# Patient Record
Sex: Female | Born: 1975 | Race: White | Hispanic: No | Marital: Single | Smoking: Current some day smoker
Health system: Southern US, Community
[De-identification: ages and names within clinical notes are randomized; demographics above are authoritative.]

## PROBLEM LIST (undated history)

## (undated) DIAGNOSIS — K219 Gastro-esophageal reflux disease without esophagitis: Secondary | ICD-10-CM

## (undated) HISTORY — PX: KNEE SURGERY: SHX244

## (undated) HISTORY — PX: TUBAL LIGATION: SHX77

## (undated) HISTORY — PX: EYE SURGERY: SHX253

---

## 2005-09-07 ENCOUNTER — Emergency Department (HOSPITAL_COMMUNITY): Admission: EM | Admit: 2005-09-07 | Discharge: 2005-09-07 | Payer: Self-pay | Admitting: Emergency Medicine

## 2009-01-02 ENCOUNTER — Emergency Department (HOSPITAL_COMMUNITY): Admission: EM | Admit: 2009-01-02 | Discharge: 2009-01-02 | Payer: Self-pay | Admitting: *Deleted

## 2009-10-06 ENCOUNTER — Emergency Department (HOSPITAL_COMMUNITY): Admission: EM | Admit: 2009-10-06 | Discharge: 2009-10-06 | Payer: Self-pay | Admitting: Emergency Medicine

## 2009-12-03 ENCOUNTER — Emergency Department (HOSPITAL_BASED_OUTPATIENT_CLINIC_OR_DEPARTMENT_OTHER): Admission: EM | Admit: 2009-12-03 | Discharge: 2009-12-04 | Payer: Self-pay | Admitting: Emergency Medicine

## 2009-12-04 ENCOUNTER — Ambulatory Visit: Payer: Self-pay | Admitting: Radiology

## 2009-12-06 ENCOUNTER — Emergency Department (HOSPITAL_BASED_OUTPATIENT_CLINIC_OR_DEPARTMENT_OTHER): Admission: EM | Admit: 2009-12-06 | Discharge: 2009-12-06 | Payer: Self-pay | Admitting: Emergency Medicine

## 2009-12-06 ENCOUNTER — Ambulatory Visit: Payer: Self-pay | Admitting: Radiology

## 2010-01-14 ENCOUNTER — Emergency Department (HOSPITAL_BASED_OUTPATIENT_CLINIC_OR_DEPARTMENT_OTHER): Admission: EM | Admit: 2010-01-14 | Discharge: 2010-01-14 | Payer: Self-pay | Admitting: Emergency Medicine

## 2010-01-14 ENCOUNTER — Ambulatory Visit: Payer: Self-pay | Admitting: Radiology

## 2010-02-12 ENCOUNTER — Ambulatory Visit: Payer: Self-pay | Admitting: Diagnostic Radiology

## 2010-02-12 ENCOUNTER — Emergency Department (HOSPITAL_BASED_OUTPATIENT_CLINIC_OR_DEPARTMENT_OTHER): Admission: EM | Admit: 2010-02-12 | Discharge: 2010-02-12 | Payer: Self-pay | Admitting: Emergency Medicine

## 2010-03-27 ENCOUNTER — Emergency Department (HOSPITAL_BASED_OUTPATIENT_CLINIC_OR_DEPARTMENT_OTHER): Admission: EM | Admit: 2010-03-27 | Discharge: 2010-03-27 | Payer: Self-pay | Admitting: Emergency Medicine

## 2010-03-27 ENCOUNTER — Ambulatory Visit: Payer: Self-pay | Admitting: Diagnostic Radiology

## 2010-10-09 LAB — COMPREHENSIVE METABOLIC PANEL
Albumin: 3.9 g/dL (ref 3.5–5.2)
BUN: 8 mg/dL (ref 6–23)
GFR calc Af Amer: >60 mL/min (ref >60)
GFR calc non Af Amer: >60 mL/min (ref >60)
Glucose, Bld: 80 mg/dL (ref 70–99)
Potassium: 4 mEq/L (ref 3.5–5.1)
Sodium: 141 mEq/L (ref 135–145)
Total Bilirubin: 0.4 mg/dL (ref 0.3–1.2)
Total Protein: 7 g/dL (ref 6.0–8.3)

## 2010-10-09 LAB — DIFFERENTIAL
Basophils Absolute: 0.1 10*3/uL (ref 0.0–0.1)
Basophils Relative: 1 % (ref 0–1)
Eosinophils Absolute: 0.2 10*3/uL (ref 0.0–0.7)
Monocytes Absolute: 0.5 10*3/uL (ref 0.1–1.0)
Neutro Abs: 9.5 10*3/uL — ABNORMAL HIGH (ref 1.7–7.7)
Neutrophils Relative %: 69 % (ref 43–77)

## 2010-10-09 LAB — URINALYSIS, ROUTINE W REFLEX MICROSCOPIC
Glucose, UA: NEGATIVE mg/dL
Hgb urine dipstick: NEGATIVE
Ketones, ur: NEGATIVE mg/dL
Protein, ur: NEGATIVE mg/dL
Specific Gravity, Urine: 1.011 (ref 1.005–1.030)
Urobilinogen, UA: 0.2 mg/dL (ref 0.0–1.0)

## 2010-10-09 LAB — CBC
MCH: 33.4 pg (ref 26.0–34.0)
MCHC: 34.7 g/dL (ref 30.0–36.0)
MCV: 96.3 fL (ref 78.0–100.0)
WBC: 13.5 10*3/uL — ABNORMAL HIGH (ref 4.0–10.5)

## 2010-10-17 LAB — URINALYSIS, ROUTINE W REFLEX MICROSCOPIC
Bilirubin Urine: NEGATIVE
Glucose, UA: NEGATIVE mg/dL
Hgb urine dipstick: NEGATIVE
Ketones, ur: NEGATIVE mg/dL
Nitrite: NEGATIVE
Protein, ur: NEGATIVE mg/dL
Specific Gravity, Urine: 1.021 (ref 1.005–1.030)
Urobilinogen, UA: 1 mg/dL (ref 0.0–1.0)
pH: 8 (ref 5.0–8.0)

## 2010-10-17 LAB — URINE MICROSCOPIC-ADD ON

## 2010-10-17 LAB — POCT PREGNANCY, URINE: Preg Test, Ur: NEGATIVE

## 2010-10-31 LAB — URINALYSIS, ROUTINE W REFLEX MICROSCOPIC
Ketones, ur: NEGATIVE mg/dL
Nitrite: NEGATIVE
Specific Gravity, Urine: 1.024 (ref 1.005–1.030)

## 2010-10-31 LAB — URINE MICROSCOPIC-ADD ON

## 2010-10-31 LAB — POCT PREGNANCY, URINE: Preg Test, Ur: NEGATIVE

## 2013-05-02 ENCOUNTER — Encounter (HOSPITAL_COMMUNITY): Payer: Self-pay | Admitting: Emergency Medicine

## 2013-05-02 ENCOUNTER — Emergency Department (HOSPITAL_COMMUNITY)
Admission: EM | Admit: 2013-05-02 | Discharge: 2013-05-02 | Disposition: A | Discharge status: Home / Self Care | Payer: Medicaid Other | Attending: Emergency Medicine | Admitting: Emergency Medicine

## 2013-05-02 ENCOUNTER — Emergency Department (HOSPITAL_COMMUNITY): Payer: Medicaid Other

## 2013-05-02 DIAGNOSIS — Z880 Allergy status to penicillin: Secondary | ICD-10-CM | POA: Insufficient documentation

## 2013-05-02 DIAGNOSIS — S6990XA Unspecified injury of unspecified wrist, hand and finger(s), initial encounter: Secondary | ICD-10-CM | POA: Insufficient documentation

## 2013-05-02 DIAGNOSIS — Y939 Activity, unspecified: Secondary | ICD-10-CM | POA: Insufficient documentation

## 2013-05-02 DIAGNOSIS — M79641 Pain in right hand: Secondary | ICD-10-CM

## 2013-05-02 DIAGNOSIS — S59909A Unspecified injury of unspecified elbow, initial encounter: Secondary | ICD-10-CM | POA: Insufficient documentation

## 2013-05-02 DIAGNOSIS — W230XXA Caught, crushed, jammed, or pinched between moving objects, initial encounter: Secondary | ICD-10-CM | POA: Insufficient documentation

## 2013-05-02 DIAGNOSIS — Y929 Unspecified place or not applicable: Secondary | ICD-10-CM | POA: Insufficient documentation

## 2013-05-02 DIAGNOSIS — F17200 Nicotine dependence, unspecified, uncomplicated: Secondary | ICD-10-CM | POA: Insufficient documentation

## 2013-05-02 NOTE — ED Notes (Signed)
Presents with right hand injury, hand was crushed between 2 logs, accident occurred one hour ago. Hand swollen, able to wiggle digits. CMS intact.

## 2013-05-02 NOTE — ED Provider Notes (Signed)
CSN: [id number]     Arrival date & time 05/02/13  2102 History   This chart was scribed for non-physician practitioner Roxy Horseman, PA-C, working with Gilda Crease, MD by Dorothey Baseman, ED Scribe. This patient was seen in room TR09C/TR09C and the patient's care was started at 9:25 PM.    Chief Complaint  Patient presents with  . Hand Injury   The history is provided by the patient. No language interpreter was used.   HPI Comments: Diane Norris is a 37 y.o. female who presents to the Emergency Department with a chief complaint of an injury to the right hand that occurred approximately 1 hour ago when the patient reports that her hand was crushed between 2 large logs. She reports an associated constant pain to the right hand and wrist with swelling to the area.  History reviewed. No pertinent past medical history. History reviewed. No pertinent past surgical history. History reviewed. No pertinent family history. History  Substance Use Topics  . Smoking status: Current Every Day Smoker    Types: Cigarettes  . Smokeless tobacco: Not on file  . Alcohol Use: No   OB History   Grav Para Term Preterm Abortions TAB SAB Ect Mult Living                 Review of Systems  A complete 10 system review of systems was obtained and all systems are negative except as noted in the HPI and PMH.   Allergies  Nsaids and Penicillins  Home Medications  No current outpatient prescriptions on file.  BP 108/62  Pulse 100  Temp(Src) 98.1 F (36.7 C) (Oral)  Resp 18  Wt 140 lb (63.504 kg)  SpO2 98%  Physical Exam  Nursing note and vitals reviewed. Constitutional: She is oriented to person, place, and time. She appears well-developed and well-nourished. No distress.  HENT:  Head: Normocephalic and atraumatic.  Eyes: Conjunctivae are normal.  Neck: Normal range of motion. Neck supple.  Cardiovascular: Intact distal pulses.   Brisk capillary refill.   Pulmonary/Chest: Effort  normal. No respiratory distress.  Abdominal: She exhibits no distension.  Musculoskeletal: Normal range of motion.  Right hand is tender to palpation diffusely with moderate right wrist tenderness, moderate swelling to the right wrist over the 4th and 5th metacarpal. Range of motion and strength is reduced secondary to pain.   Neurological: She is alert and oriented to person, place, and time.  Sensation and strength is intact.   Skin: Skin is warm and dry.  Psychiatric: She has a normal mood and affect. Her behavior is normal.    ED Course  Procedures (including critical care time)  DIAGNOSTIC STUDIES: Oxygen Saturation is 98% on room air, normal by my interpretation.    COORDINATION OF CARE: 9:28PM- Will order x-rays of the right hand and wrist. Discussed treatment plan with patient at bedside and patient verbalized agreement.     Labs Review Labs Reviewed - No data to display  Imaging Review Dg Wrist Complete Right  05/02/2013   *RADIOLOGY REPORT*  Clinical Data: Smashed right hand and wrist between two logs; right wrist pain.  RIGHT WRIST - COMPLETE 3+ VIEW  Comparison: Right hand radiographs performed 12/20/2012  Findings: There is no evidence of fracture or dislocation.  The carpal rows are intact, and demonstrate normal alignment.  The joint spaces are preserved.  Negative ulnar variance is noted.  No significant soft tissue abnormalities are seen.  IMPRESSION: No evidence of fracture or dislocation.  Original Report Authenticated By: Tonia Ghent, M.D.   Dg Hand Complete Right  05/02/2013   *RADIOLOGY REPORT*  Clinical Data: Smashed right hand and wrist between two logs; pain at the third metacarpal and metacarpophalangeal joint.  RIGHT HAND - COMPLETE 3+ VIEW  Comparison: Right hand radiographs performed 12/20/2012  Findings: There is no evidence of fracture or dislocation.  The joint spaces are preserved; the soft tissues are unremarkable in appearance.  The carpal rows are  intact, and demonstrate normal alignment.  Negative ulnar variance is noted.  IMPRESSION: No evidence of fracture or dislocation.   Original Report Authenticated By: Tonia Ghent, M.D.    EKG Interpretation   None       MDM   1. Right hand pain    Patient with right hand pain/sprain following having a piece of wood hit her hand. No fractures. Patient placed in wrist splint. Rice therapy and NSAIDs. Patient stable and ready for discharge. Hand followup. I personally performed the services described in this documentation, which was scribed in my presence. The recorded information has been reviewed and is accurate.     Roxy Horseman, PA-C 05/03/13 0000

## 2013-05-04 NOTE — ED Provider Notes (Signed)
Medical screening examination/treatment/procedure(s) were performed by non-physician practitioner and as supervising physician I was immediately available for consultation/collaboration.    Christopher J. Pollina, MD 05/04/13 1555 

## 2013-07-25 ENCOUNTER — Ambulatory Visit
Admission: RE | Admit: 2013-07-25 | Discharge: 2013-07-25 | Disposition: A | Discharge status: Home / Self Care | Payer: Medicaid Other | Source: Ambulatory Visit | Attending: Physician Assistant | Admitting: Physician Assistant

## 2013-07-25 ENCOUNTER — Other Ambulatory Visit: Payer: Self-pay | Admitting: Physician Assistant

## 2013-07-25 DIAGNOSIS — R519 Headache, unspecified: Secondary | ICD-10-CM

## 2013-07-25 DIAGNOSIS — G971 Other reaction to spinal and lumbar puncture: Secondary | ICD-10-CM

## 2013-07-25 DIAGNOSIS — R51 Headache: Principal | ICD-10-CM

## 2013-07-25 MED ORDER — IOHEXOL 180 MG/ML  SOLN
1.0000 mL | Freq: Once | INTRAMUSCULAR | Status: AC | PRN
  Administered 2013-07-25: 1 mL via EPIDURAL

## 2013-07-25 NOTE — Progress Notes (Signed)
Blood drawn for blood patch from left AC, site is unremarkable and pt tolerated blood draw well.

## 2013-07-25 NOTE — Discharge Instructions (Signed)
Blood Patch Discharge Instructions ° °1. Go home and rest quietly for the next 24 hours.  It is important to lie flat for the next 24 hours.  Get up only to go to the restroom.  You may lie in the bed or on a couch on your back, your stomach, your left side or your right side.  You may have one pillow under your head.  You may have pillows between your knees while you are on your side or under your knees while you are on your back. ° °2. DO NOT drive today.  Recline the seat as far back as it will go, while still wearing your seat belt, on the way home. ° °3. You may get up to go to the bathroom as needed.  You may sit up for 10 minutes to eat.  You may resume your normal diet and medications unless otherwise indicated.  Drink lots of extra fluids today and tomorrow ° °4. You may resume normal activities after your 24 hours of bed rest is over; however, do not exert yourself strongly or do any heavy lifting tomorrow. ° °5. Call your physician for a follow-up appointment.  ° °6. If you have any questions or if complications develop after you arrive home, please call [phone]. ° °Discharge instructions have been explained to the patient.  The patient, or the person responsible for the patient, fully understands these instructions. °

## 2014-08-04 ENCOUNTER — Encounter (HOSPITAL_BASED_OUTPATIENT_CLINIC_OR_DEPARTMENT_OTHER): Payer: Self-pay | Admitting: Emergency Medicine

## 2014-08-04 ENCOUNTER — Emergency Department (HOSPITAL_BASED_OUTPATIENT_CLINIC_OR_DEPARTMENT_OTHER): Payer: Medicaid Other

## 2014-08-04 ENCOUNTER — Emergency Department (HOSPITAL_BASED_OUTPATIENT_CLINIC_OR_DEPARTMENT_OTHER)
Admission: EM | Admit: 2014-08-04 | Discharge: 2014-08-04 | Disposition: A | Discharge status: Home / Self Care | Payer: Medicaid Other | Attending: Emergency Medicine | Admitting: Emergency Medicine

## 2014-08-04 DIAGNOSIS — Z720 Tobacco use: Secondary | ICD-10-CM | POA: Diagnosis not present

## 2014-08-04 DIAGNOSIS — Z3202 Encounter for pregnancy test, result negative: Secondary | ICD-10-CM | POA: Diagnosis not present

## 2014-08-04 DIAGNOSIS — Z79899 Other long term (current) drug therapy: Secondary | ICD-10-CM | POA: Diagnosis not present

## 2014-08-04 DIAGNOSIS — Y9289 Other specified places as the place of occurrence of the external cause: Secondary | ICD-10-CM | POA: Diagnosis not present

## 2014-08-04 DIAGNOSIS — R1110 Vomiting, unspecified: Secondary | ICD-10-CM | POA: Diagnosis not present

## 2014-08-04 DIAGNOSIS — Z880 Allergy status to penicillin: Secondary | ICD-10-CM | POA: Diagnosis not present

## 2014-08-04 DIAGNOSIS — Y9301 Activity, walking, marching and hiking: Secondary | ICD-10-CM | POA: Insufficient documentation

## 2014-08-04 DIAGNOSIS — S79911A Unspecified injury of right hip, initial encounter: Secondary | ICD-10-CM | POA: Insufficient documentation

## 2014-08-04 DIAGNOSIS — W19XXXA Unspecified fall, initial encounter: Secondary | ICD-10-CM

## 2014-08-04 DIAGNOSIS — Y998 Other external cause status: Secondary | ICD-10-CM | POA: Diagnosis not present

## 2014-08-04 DIAGNOSIS — W108XXA Fall (on) (from) other stairs and steps, initial encounter: Secondary | ICD-10-CM | POA: Insufficient documentation

## 2014-08-04 DIAGNOSIS — M25551 Pain in right hip: Secondary | ICD-10-CM

## 2014-08-04 LAB — PREGNANCY, URINE: Preg Test, Ur: NEGATIVE

## 2014-08-04 MED ORDER — ONDANSETRON 4 MG PO TBDP
4.0000 mg | ORAL_TABLET | Freq: Once | ORAL | Status: AC
  Administered 2014-08-04: 4 mg via ORAL
  Filled 2014-08-04: qty 1

## 2014-08-04 MED ORDER — HYDROCODONE-ACETAMINOPHEN 5-325 MG PO TABS: 1.0000 | ORAL_TABLET | ORAL | Status: DC | PRN

## 2014-08-04 MED ORDER — HYDROCODONE-ACETAMINOPHEN 5-325 MG PO TABS
2.0000 | ORAL_TABLET | Freq: Once | ORAL | Status: AC
  Administered 2014-08-04: 2 via ORAL
  Filled 2014-08-04: qty 2

## 2014-08-04 NOTE — ED Notes (Signed)
Patient transported to X-ray 

## 2014-08-04 NOTE — Discharge Instructions (Signed)
Return to the emergency room with worsening of symptoms, new symptoms or with symptoms that are concerning, especially fevers, redness, swelling, red streaks, severe pain with movement of hip, numbness, tingling, weakness. RICE: Rest, Ice (three cycles of 20 mins on, 20mins off at least twice a day), compression/brace, elevation. Heating pad works well for back pain. Ibuprofen 400mg  (2 tablets 200mg ) every 5-6 hours for 3-5 days Norco for severe pain Do not operate machinery, drive or drink alcohol while taking narcotics or muscle relaxers. Follow up with PCP/orthopedist if symptoms worsen or are persistent.  Hip Pain Your hip is the joint between your upper legs and your lower pelvis. The bones, cartilage, tendons, and muscles of your hip joint perform a lot of work each day supporting your body weight and allowing you to move around. Hip pain can range from a minor ache to severe pain in one or both of your hips. Pain may be felt on the inside of the hip joint near the groin, or the outside near the buttocks and upper thigh. You may have swelling or stiffness as well.  HOME CARE INSTRUCTIONS   Take medicines only as directed by your health care provider.  Apply ice to the injured area:  Put ice in a plastic bag.  Place a towel between your skin and the bag.  Leave the ice on for 15-20 minutes at a time, 3-4 times a day.  Keep your leg raised (elevated) when possible to lessen swelling.  Avoid activities that cause pain.  Follow specific exercises as directed by your health care provider.  Sleep with a pillow between your legs on your most comfortable side.  Record how often you have hip pain, the location of the pain, and what it feels like. SEEK MEDICAL CARE IF:   You are unable to put weight on your leg.  Your hip is red or swollen or very tender to touch.  Your pain or swelling continues or worsens after 1 week.  You have increasing difficulty walking.  You have a  fever. SEEK IMMEDIATE MEDICAL CARE IF:   You have fallen.  You have a sudden increase in pain and swelling in your hip. MAKE SURE YOU:   Understand these instructions.  Will watch your condition.  Will get help right away if you are not doing well or get worse. Document Released: 12/28/2009 Document Revised: 11/24/2013 Document Reviewed: 03/06/2013 [facility] Patient Information 2015 [employer], [place]. This information is not intended to replace advice given to you by your health care provider. Make sure you discuss any questions you have with your health care provider.

## 2014-08-04 NOTE — ED Provider Notes (Signed)
CSN: [id number]     Arrival date & time 08/04/14  1347 History   First MD Initiated Contact with Patient 08/04/14 1512     Chief Complaint  Patient presents with  . Fall     (Consider location/radiation/quality/duration/timing/severity/associated sxs/prior Treatment) HPI  Diane Norris is a 39 y.o. female without significant past medical history presenting after mechanical fall today where she landed on her right hip. Patient states she was walking down stairs and slipped while walking her dog. She noted immediate pain to her right hip as well as her buttock. She denies head injury or loss of consciousness. She denies numbness, tingling, weakness. Patient was ambulatory with significant pain. She has not taken anything for this. Movement and ambulation makes pain worse. Patient has no history of taking steroids long-term or other medical conditions. No history of diabetes.  No past medical history on file. Past Surgical History  Procedure Laterality Date  . Eye surgery    . Knee surgery     No family history on file. History  Substance Use Topics  . Smoking status: Current Every Day Smoker    Types: Cigarettes  . Smokeless tobacco: Not on file  . Alcohol Use: No   OB History    No data available     Review of Systems  Constitutional: Negative for fever and chills.  Gastrointestinal: Positive for vomiting. Negative for nausea and diarrhea.  Musculoskeletal: Positive for myalgias. Negative for gait problem.  Skin: Negative for wound.      Allergies  Nsaids and Penicillins  Home Medications   Prior to Admission medications   Medication Sig Start Date End Date Taking? Authorizing Provider  albuterol (PROVENTIL HFA;VENTOLIN HFA) 108 (90 BASE) MCG/ACT inhaler Inhale 2 puffs into the lungs every 6 (six) hours as needed for wheezing.    Historical Provider, MD  HYDROcodone-acetaminophen (NORCO/VICODIN) 5-325 MG per tablet Take 1-2 tablets by mouth every 4 (four) hours as  needed for moderate pain or severe pain. 08/04/14   Louann Sjogren, PA-C   BP 110/67 mmHg  Pulse 85  Temp(Src) 98.5 F (36.9 C) (Oral)  Resp 14  Ht  (1.651 m)  Wt 138 lb (62.596 kg)  BMI 22.96 kg/m2  SpO2 100%  LMP 08/02/2014 Physical Exam  Constitutional: She appears well-developed and well-nourished. No distress.  HENT:  Head: Normocephalic and atraumatic.  Eyes: Conjunctivae are normal. Right eye exhibits no discharge. Left eye exhibits no discharge.  Cardiovascular:  2+ pedal pulses equal bilaterally  Pulmonary/Chest: Effort normal. No respiratory distress.  Musculoskeletal:  Patient with tenderness to lateral and posterior hip worse with range of motion especially internal and external rotation. No overt deformity noted. No redness or swelling no red streaks.  Neurological: She is alert. Coordination normal.  5/5 strength in bilateral lower extremities. Sensation intact. Antalgic gait  Skin: Skin is warm and dry. She is not diaphoretic.  Psychiatric: She has a normal mood and affect. Her behavior is normal.  Nursing note and vitals reviewed.   ED Course  Procedures (including critical care time) Labs Review Labs Reviewed  PREGNANCY, URINE    Imaging Review Ct Hip Right Wo Contrast  08/04/2014   CLINICAL DATA:  Right hip pain after recent fall. Initial encounter.  EXAM: CT OF THE RIGHT HIP WITHOUT CONTRAST  TECHNIQUE: Multidetector CT imaging of the right hip was performed according to the standard protocol. Multiplanar CT image reconstructions were also generated.  COMPARISON:  Pelvic and right hip radiographs of 08/04/2014.  Pelvic CT 07/20/2014 and 01/23/2013.  FINDINGS: Examination is limited to the right hip and lower right hemipelvis. The right femoral head is located and appears normal. There is no evidence of proximal femur fracture or dislocation. Within the posterior acetabulum, there is serpiginous lucency corresponding with the questioned radiographic  finding. This has somewhat sclerotic margins and appears similar to the prior CTs, most likely due to a penetrating vessel. No definite acetabular fracture identified.  The gluteus and visualized proximal thigh musculature appears unremarkable.  IMPRESSION: 1. No definite acute osseous findings at the right hip. 2. The questioned finding on the prior radiographs is most likely due to a penetrating vessel, similar to prior CTs.   Electronically Signed   By: Roxy HorsemanBill  Veazey M.D.   On: 08/04/2014 16:50   Dg Hip Unilat With Pelvis 2-3 Views Right  08/04/2014   CLINICAL DATA:  Fall on concrete.  Right hip pain.  EXAM: DG HIP W/ PELVIS 2-3V*R*  COMPARISON:  None.  FINDINGS: Linear lucency within the superior lateral acetabulum. No femoral neck abnormality. SI joints are symmetric and unremarkable. Left hip unremarkable.  IMPRESSION: Linear lucency within the superior lateral acetabulum on the right. Difficult to exclude nondisplaced fracture. Consider CT for further evaluation if there is high clinical suspicion.   Electronically Signed   By: Charlett NoseKevin  Dover M.D.   On: 08/04/2014 14:32     EKG Interpretation None      MDM   Final diagnoses:  Hip pain, right  Fall, initial encounter   Patient with fall to right hip with pain but no redness, swelling, fevers and I doubt septic arthritis. No numbness, tingling, weakness. X-ray with unclear findings. Proceeded to CT to rule out fracture. CT without evidence of fracture. Patient with likely musculoskeletal strain from fall on concrete. Patient to treat with rice and pain medicines indicated. Driving and sedation precautions provided. Patient to follow-up with her primary care provider/ortho for persistent symptoms or worsening symptoms.  Discussed return precautions with patient. Discussed all results and patient verbalizes understanding and agrees with plan.  Case has been discussed with Dr. Littie DeedsGentry who agrees with the above plan and to discharge.    Louann SjogrenVictoria  L Bryn Saline, PA-C 08/04/14 1725  Mirian MoMatthew Gentry, MD 08/05/14 [phone]

## 2014-08-04 NOTE — ED Notes (Signed)
Pt states she fell and injured her right hip this am while taking out the dog.

## 2014-10-09 ENCOUNTER — Encounter (HOSPITAL_COMMUNITY): Payer: Self-pay

## 2014-10-09 ENCOUNTER — Emergency Department (HOSPITAL_COMMUNITY)
Admission: EM | Admit: 2014-10-09 | Discharge: 2014-10-09 | Disposition: A | Discharge status: Home / Self Care | Payer: Medicaid Other | Attending: Emergency Medicine | Admitting: Emergency Medicine

## 2014-10-09 ENCOUNTER — Emergency Department (HOSPITAL_COMMUNITY): Payer: Medicaid Other

## 2014-10-09 DIAGNOSIS — Z880 Allergy status to penicillin: Secondary | ICD-10-CM | POA: Diagnosis not present

## 2014-10-09 DIAGNOSIS — R0789 Other chest pain: Secondary | ICD-10-CM | POA: Diagnosis not present

## 2014-10-09 DIAGNOSIS — R110 Nausea: Secondary | ICD-10-CM | POA: Insufficient documentation

## 2014-10-09 DIAGNOSIS — Z720 Tobacco use: Secondary | ICD-10-CM | POA: Insufficient documentation

## 2014-10-09 DIAGNOSIS — M79602 Pain in left arm: Secondary | ICD-10-CM | POA: Insufficient documentation

## 2014-10-09 DIAGNOSIS — Z79899 Other long term (current) drug therapy: Secondary | ICD-10-CM | POA: Diagnosis not present

## 2014-10-09 DIAGNOSIS — R079 Chest pain, unspecified: Secondary | ICD-10-CM | POA: Diagnosis present

## 2014-10-09 LAB — CBC WITH DIFFERENTIAL/PLATELET
Basophils Absolute: 0 10*3/uL (ref 0.0–0.1)
Basophils Relative: 0 % (ref 0–1)
EOS ABS: 0.1 10*3/uL (ref 0.0–0.7)
Eosinophils Relative: 1 % (ref 0–5)
HEMATOCRIT: 42.6 % (ref 36.0–46.0)
HEMOGLOBIN: 14.5 g/dL (ref 12.0–15.0)
Lymphocytes Relative: 24 % (ref 12–46)
Lymphs Abs: 3 10*3/uL (ref 0.7–4.0)
MCH: 33.6 pg (ref 26.0–34.0)
MCHC: 34 g/dL (ref 30.0–36.0)
MCV: 98.8 fL (ref 78.0–100.0)
Monocytes Absolute: 0.8 10*3/uL (ref 0.1–1.0)
Monocytes Relative: 6 % (ref 3–12)
NEUTROS PCT: 69 % (ref 43–77)
Neutro Abs: 8.8 10*3/uL — ABNORMAL HIGH (ref 1.7–7.7)
Platelets: 383 10*3/uL (ref 150–400)
RBC: 4.31 MIL/uL (ref 3.87–5.11)
RDW: 13 % (ref 11.5–15.5)
WBC: 12.7 10*3/uL — AB (ref 4.0–10.5)

## 2014-10-09 LAB — BASIC METABOLIC PANEL
ANION GAP: 9 (ref 5–15)
BUN: 13 mg/dL (ref 6–23)
CALCIUM: 9.2 mg/dL (ref 8.4–10.5)
CO2: 25 mmol/L (ref 19–32)
Chloride: 105 mmol/L (ref 96–112)
Creatinine, Ser: 0.73 mg/dL (ref 0.50–1.10)
GLUCOSE: 108 mg/dL — AB (ref 70–99)
POTASSIUM: 3.7 mmol/L (ref 3.5–5.1)
SODIUM: 139 mmol/L (ref 135–145)

## 2014-10-09 LAB — TROPONIN I

## 2014-10-09 LAB — D-DIMER, QUANTITATIVE (NOT AT ARMC): D-Dimer, Quant: 0.35 ug/mL-FEU (ref 0.00–0.48)

## 2014-10-09 MED ORDER — MORPHINE SULFATE 2 MG/ML IJ SOLN
2.0000 mg | Freq: Once | INTRAMUSCULAR | Status: AC
  Administered 2014-10-09: 2 mg via INTRAVENOUS
  Filled 2014-10-09: qty 1

## 2014-10-09 MED ORDER — ONDANSETRON HCL 4 MG/2ML IJ SOLN
4.0000 mg | Freq: Once | INTRAMUSCULAR | Status: AC
  Administered 2014-10-09: 4 mg via INTRAVENOUS
  Filled 2014-10-09: qty 2

## 2014-10-09 MED ORDER — HYDROCODONE-ACETAMINOPHEN 5-325 MG PO TABS: 1.0000 | ORAL_TABLET | Freq: Four times a day (QID) | ORAL | Status: AC | PRN

## 2014-10-09 MED ORDER — CYCLOBENZAPRINE HCL 5 MG PO TABS: 5.0000 mg | ORAL_TABLET | Freq: Two times a day (BID) | ORAL | Status: AC | PRN

## 2014-10-09 NOTE — ED Notes (Signed)
Patient transported to X-ray 

## 2014-10-09 NOTE — Progress Notes (Signed)
EPIC e medicaid response [facility]  Address: [address] [place], [place] [id number] Contact: [facility] Telephone: [phone] Telephone: [phone]  EPIC updated

## 2014-10-09 NOTE — Discharge Instructions (Signed)

## 2014-10-09 NOTE — ED Notes (Signed)
Pt states chest pain with left arm pain/tingling since this morning. No hx of same.  Some shortness of breath.  Denies cough/cold symptoms.  Hx of reflux

## 2014-10-09 NOTE — ED Provider Notes (Signed)
CSN: [id number]     Arrival date & time 10/09/14  4098 History   First MD Initiated Contact with Patient 10/09/14 [phone]     Chief Complaint  Patient presents with  . Chest Pain  . Arm Pain     (Consider location/radiation/quality/duration/timing/severity/associated sxs/prior Treatment) Patient is a 39 y.o. female presenting with chest pain and arm pain. The history is provided by the patient. No language interpreter was used.  Chest Pain Pain location:  L chest Pain quality: dull, pressure and sharp   Pain radiates to:  L arm Pain radiates to the back: no   Pain severity:  Severe Onset quality:  Sudden Duration:  3 hours Timing:  Constant Progression:  Worsening Chronicity:  New Relieved by:  Nothing Worsened by:  Exertion and movement Ineffective treatments:  None tried Associated symptoms: nausea   Associated symptoms: no abdominal pain, no fever, no lower extremity edema, no shortness of breath and not vomiting   Risk factors: smoking   Risk factors: no birth control, no prior DVT/PE and no surgery   Arm Pain Associated symptoms include chest pain. Pertinent negatives include no abdominal pain and no shortness of breath.    History reviewed. No pertinent past medical history. Past Surgical History  Procedure Laterality Date  . Eye surgery    . Knee surgery     History reviewed. No pertinent family history. History  Substance Use Topics  . Smoking status: Current Every Day Smoker    Types: Cigarettes  . Smokeless tobacco: Not on file  . Alcohol Use: No   OB History    No data available     Review of Systems  Constitutional: Negative for fever.  Respiratory: Negative for shortness of breath.   Cardiovascular: Positive for chest pain.  Gastrointestinal: Positive for nausea. Negative for vomiting and abdominal pain.  All other systems reviewed and are negative.     Allergies  Nsaids and Penicillins  Home Medications   Prior to Admission medications    Medication Sig Start Date End Date Taking? Authorizing Provider  hydrOXYzine (ATARAX/VISTARIL) 25 MG tablet Take 1 tablet by mouth 4 (four) times daily as needed. 09/26/14  Yes Historical Provider, MD  HYDROcodone-acetaminophen (NORCO/VICODIN) 5-325 MG per tablet Take 1-2 tablets by mouth every 4 (four) hours as needed for moderate pain or severe pain. 08/04/14   Oswaldo Conroy, PA-C   BP 122/59 mmHg  Pulse 73  Temp(Src) 97.9 F (36.6 C) (Oral)  Resp 18  SpO2 100%  LMP 10/01/2014 Physical Exam  Constitutional: She is oriented to person, place, and time. She appears well-developed and well-nourished.  HENT:  Head: Normocephalic and atraumatic.  Cardiovascular: Normal rate and regular rhythm.   No murmur heard. Pulses:      Radial pulses are 2+ on the right side, and 2+ on the left side.  Pulmonary/Chest: Effort normal and breath sounds normal. No respiratory distress. She exhibits tenderness.  Abdominal: Soft. There is no tenderness. There is no rebound and no guarding.  Musculoskeletal: She exhibits no edema or tenderness.  Neurological: She is alert and oriented to person, place, and time.  Skin: Skin is warm and dry.  Psychiatric: She has a normal mood and affect. Her behavior is normal.  Nursing note and vitals reviewed.   ED Course  Procedures (including critical care time) Labs Review Labs Reviewed  BASIC METABOLIC PANEL - Abnormal; Notable for the following:    Glucose, Bld 108 (*)    All other components within normal  limits  CBC WITH DIFFERENTIAL/PLATELET - Abnormal; Notable for the following:    WBC 12.7 (*)    Neutro Abs 8.8 (*)    All other components within normal limits  TROPONIN I  D-DIMER, QUANTITATIVE  TROPONIN I    Imaging Review Dg Chest 2 View  10/09/2014   CLINICAL DATA:  Left-sided chest pain radiating to left upper extremity  EXAM: CHEST  2 VIEW  COMPARISON:  September 10, 2012  FINDINGS: There is slight scarring in the right base. Lungs elsewhere  clear. Heart size and pulmonary vascularity are normal. No adenopathy. No pneumothorax. No bone lesions.  IMPRESSION: Slight scarring right base. Lungs otherwise clear. Heart size within normal limits.   Electronically Signed   By: Bretta BangWilliam  Woodruff III M.D.   On: 10/09/2014 10:34     EKG Interpretation   Date/Time:  Friday October 09 2014 09:25:52 EDT Ventricular Rate:  75 PR Interval:  129 QRS Duration: 100 QT Interval:  393 QTC Calculation: 439 R Axis:   72 Text Interpretation:  Sinus rhythm Left atrial enlargement RSR' in V1 or  V2, right VCD or RVH Baseline wander in lead(s) II V2 Confirmed by Lincoln Brighamees,  Liz [id number]) on 10/09/2014 9:35:16 AM      MDM   Final diagnoses:  Acute chest wall pain   patient here for evaluation of left-sided chest pain. Pain is atypical and reproducible on exam. Delta troponin checked in the emergency department given family history, but clinical picture is not consistent with ACS and troponins are negative. Patient is a risk for DVT/PE and d-dimer is negative. Clinical picture is consistent with muscular skeletal chest pain, providing muscle relaxants and pain relief. Patient is not a candidate for NSAIDs given her allergy. Discussed PCP follow-up as well as return precautions.  Tilden FossaElizabeth Chilton Sallade, MD 10/09/14 1451

## 2014-11-20 ENCOUNTER — Encounter (HOSPITAL_BASED_OUTPATIENT_CLINIC_OR_DEPARTMENT_OTHER): Payer: Self-pay

## 2014-11-20 ENCOUNTER — Emergency Department (HOSPITAL_BASED_OUTPATIENT_CLINIC_OR_DEPARTMENT_OTHER)
Admission: EM | Admit: 2014-11-20 | Discharge: 2014-11-20 | Disposition: A | Discharge status: Home / Self Care | Payer: Medicaid Other | Attending: Emergency Medicine | Admitting: Emergency Medicine

## 2014-11-20 DIAGNOSIS — Z880 Allergy status to penicillin: Secondary | ICD-10-CM | POA: Insufficient documentation

## 2014-11-20 DIAGNOSIS — X58XXXA Exposure to other specified factors, initial encounter: Secondary | ICD-10-CM | POA: Insufficient documentation

## 2014-11-20 DIAGNOSIS — Y9289 Other specified places as the place of occurrence of the external cause: Secondary | ICD-10-CM | POA: Insufficient documentation

## 2014-11-20 DIAGNOSIS — Y998 Other external cause status: Secondary | ICD-10-CM | POA: Insufficient documentation

## 2014-11-20 DIAGNOSIS — K219 Gastro-esophageal reflux disease without esophagitis: Secondary | ICD-10-CM | POA: Diagnosis not present

## 2014-11-20 DIAGNOSIS — Z720 Tobacco use: Secondary | ICD-10-CM | POA: Insufficient documentation

## 2014-11-20 DIAGNOSIS — Y9389 Activity, other specified: Secondary | ICD-10-CM | POA: Insufficient documentation

## 2014-11-20 DIAGNOSIS — S4992XA Unspecified injury of left shoulder and upper arm, initial encounter: Secondary | ICD-10-CM | POA: Insufficient documentation

## 2014-11-20 DIAGNOSIS — Z79899 Other long term (current) drug therapy: Secondary | ICD-10-CM | POA: Diagnosis not present

## 2014-11-20 DIAGNOSIS — M25512 Pain in left shoulder: Secondary | ICD-10-CM

## 2014-11-20 HISTORY — DX: Gastro-esophageal reflux disease without esophagitis: K21.9

## 2014-11-20 MED ORDER — TRAMADOL HCL 50 MG PO TABS
50.0000 mg | ORAL_TABLET | Freq: Once | ORAL | Status: AC
  Administered 2014-11-20: 50 mg via ORAL
  Filled 2014-11-20: qty 1

## 2014-11-20 MED ORDER — DIAZEPAM 5 MG PO TABS
5.0000 mg | ORAL_TABLET | Freq: Once | ORAL | Status: AC
Start: 2014-11-20 — End: 2014-11-20
  Administered 2014-11-20: 5 mg via ORAL
  Filled 2014-11-20: qty 1

## 2014-11-20 MED ORDER — DIAZEPAM 5 MG PO TABS: 5.0000 mg | ORAL_TABLET | Freq: Two times a day (BID) | ORAL | Status: AC

## 2014-11-20 MED ORDER — TRAMADOL HCL 50 MG PO TABS: 50.0000 mg | ORAL_TABLET | Freq: Four times a day (QID) | ORAL | Status: DC | PRN

## 2014-11-20 MED ORDER — DIAZEPAM 5 MG PO TABS: 5.0000 mg | ORAL_TABLET | Freq: Two times a day (BID) | ORAL | Status: DC

## 2014-11-20 MED ORDER — TRAMADOL HCL 50 MG PO TABS: 50.0000 mg | ORAL_TABLET | Freq: Four times a day (QID) | ORAL | Status: AC | PRN

## 2014-11-20 NOTE — ED Provider Notes (Signed)
CSN: [id number]     Arrival date & time 11/20/14  0945 History   First MD Initiated Contact with Patient 11/20/14 [phone]     Chief Complaint  Patient presents with  . Shoulder Pain    HPI  Patient presents with left shoulder pain. Pain began since yesterday, after she is moving objects for the day. Patient is focally in the left lateral and superior 4, worse with motion. There is no chest pain, dyspnea, neck pain. Patient has not had relief with warm compresses, no other interventions attempted. Patient is no history of shoulder dislocation or surgery of the shoulder.   Past Medical History  Diagnosis Date  . GERD (gastroesophageal reflux disease)    Past Surgical History  Procedure Laterality Date  . Eye surgery    . Knee surgery     No family history on file. History  Substance Use Topics  . Smoking status: Current Every Day Smoker    Types: Cigarettes  . Smokeless tobacco: Not on file  . Alcohol Use: No   OB History    No data available     Review of Systems  Constitutional: Negative.   Respiratory: Negative for chest tightness.   Cardiovascular: Negative for chest pain.  Musculoskeletal: Positive for arthralgias.  Skin: Negative for wound.  Allergic/Immunologic: Negative for immunocompromised state.      Allergies  Nsaids; Beeswax; and Penicillins  Home Medications   Prior to Admission medications   Medication Sig Start Date End Date Taking? Authorizing Provider  Omeprazole (PRILOSEC PO) Take by mouth.   Yes Historical Provider, MD  Ranitidine HCl (ZANTAC 75 PO) Take by mouth.   Yes Historical Provider, MD  cyclobenzaprine (FLEXERIL) 5 MG tablet Take 1 tablet (5 mg total) by mouth 2 (two) times daily as needed for muscle spasms. 10/09/14   Tilden Fossa, MD  diazepam (VALIUM) 5 MG tablet Take 1 tablet (5 mg total) by mouth 2 (two) times daily. 11/20/14   Gerhard Munch, MD  HYDROcodone-acetaminophen (NORCO/VICODIN) 5-325 MG per tablet Take 1 tablet by  mouth every 6 (six) hours as needed. 10/09/14   Tilden Fossa, MD  hydrOXYzine (ATARAX/VISTARIL) 25 MG tablet Take 1 tablet by mouth 4 (four) times daily as needed. 09/26/14   Historical Provider, MD  traMADol (ULTRAM) 50 MG tablet Take 1 tablet (50 mg total) by mouth every 6 (six) hours as needed for severe pain. 11/20/14   Gerhard Munch, MD   BP 121/63 mmHg  Pulse 94  Temp(Src) 98.8 F (37.1 C) (Oral)  Resp 20  Ht  (1.651 m)  Wt 125 lb (56.7 kg)  BMI 20.80 kg/m2  SpO2 100%  LMP 11/13/2014 Physical Exam  Constitutional: She is oriented to person, place, and time. She appears well-developed and well-nourished. No distress.  HENT:  Head: Normocephalic and atraumatic.  Eyes: Conjunctivae and EOM are normal.  Cardiovascular: Normal rate and regular rhythm.   Pulmonary/Chest: Effort normal and breath sounds normal. No stridor. No respiratory distress.  Abdominal: She exhibits no distension.  Musculoskeletal: She exhibits no edema.       Left shoulder: She exhibits tenderness, bony tenderness and pain. She exhibits normal range of motion, no swelling, no effusion, no crepitus, no deformity, no laceration, no spasm, normal pulse and normal strength.       Arms: Neurological: She is alert and oriented to person, place, and time. No cranial nerve deficit.  Skin: Skin is warm and dry.  Psychiatric: She has a normal mood and affect.  Nursing note and vitals reviewed.   ED Course  Procedures (including critical care time)   MDM   Final diagnoses:  Left shoulder pain    Patient presents with musculoskeletal left shoulder pain. No evidence for subluxation, dislocation, or neurologic compromise. Low suspicion for anginal equivalent given her prescription of substantial physical activity prior to the onset.     Gerhard Munchobert Alizabeth Antonio, MD 11/20/14 1020

## 2014-11-20 NOTE — Discharge Instructions (Signed)

## 2014-11-20 NOTE — ED Notes (Addendum)
Pt reports left shoulder pain since yesterday after removing carpet and moving furniture around. Reports she is unable to lift the left shoulder and is + for paraesthesias. States she felt and heard something "pull and pop." Pain worsens with movement.

## 2014-12-07 ENCOUNTER — Emergency Department (HOSPITAL_COMMUNITY)
Admission: EM | Admit: 2014-12-07 | Discharge: 2014-12-08 | Disposition: A | Discharge status: Home / Self Care | Payer: Medicaid Other | Attending: Emergency Medicine | Admitting: Emergency Medicine

## 2014-12-07 ENCOUNTER — Encounter (HOSPITAL_COMMUNITY): Payer: Self-pay | Admitting: Emergency Medicine

## 2014-12-07 ENCOUNTER — Emergency Department (HOSPITAL_COMMUNITY): Payer: Medicaid Other

## 2014-12-07 DIAGNOSIS — K219 Gastro-esophageal reflux disease without esophagitis: Secondary | ICD-10-CM | POA: Insufficient documentation

## 2014-12-07 DIAGNOSIS — R05 Cough: Secondary | ICD-10-CM | POA: Diagnosis not present

## 2014-12-07 DIAGNOSIS — R000 Tachycardia, unspecified: Secondary | ICD-10-CM | POA: Diagnosis not present

## 2014-12-07 DIAGNOSIS — Z79899 Other long term (current) drug therapy: Secondary | ICD-10-CM | POA: Insufficient documentation

## 2014-12-07 DIAGNOSIS — R091 Pleurisy: Secondary | ICD-10-CM | POA: Diagnosis not present

## 2014-12-07 DIAGNOSIS — R0602 Shortness of breath: Secondary | ICD-10-CM | POA: Diagnosis not present

## 2014-12-07 DIAGNOSIS — Z880 Allergy status to penicillin: Secondary | ICD-10-CM | POA: Diagnosis not present

## 2014-12-07 DIAGNOSIS — R079 Chest pain, unspecified: Secondary | ICD-10-CM | POA: Diagnosis not present

## 2014-12-07 DIAGNOSIS — Z3202 Encounter for pregnancy test, result negative: Secondary | ICD-10-CM | POA: Insufficient documentation

## 2014-12-07 DIAGNOSIS — Z720 Tobacco use: Secondary | ICD-10-CM | POA: Diagnosis not present

## 2014-12-07 LAB — I-STAT TROPONIN, ED: Troponin i, poc: 0 ng/mL (ref 0.00–0.08)

## 2014-12-07 LAB — CBC
HEMATOCRIT: 42.8 % (ref 36.0–46.0)
Hemoglobin: 14.7 g/dL (ref 12.0–15.0)
MCH: 34 pg (ref 26.0–34.0)
MCHC: 34.3 g/dL (ref 30.0–36.0)
MCV: 99.1 fL (ref 78.0–100.0)
Platelets: 373 10*3/uL (ref 150–400)
RBC: 4.32 MIL/uL (ref 3.87–5.11)
RDW: 13.6 % (ref 11.5–15.5)
WBC: 17.5 10*3/uL — ABNORMAL HIGH (ref 4.0–10.5)

## 2014-12-07 LAB — POC URINE PREG, ED: Preg Test, Ur: NEGATIVE

## 2014-12-07 LAB — BASIC METABOLIC PANEL
Anion gap: 10 (ref 5–15)
BUN: 9 mg/dL (ref 6–20)
CHLORIDE: 104 mmol/L (ref 101–111)
CO2: 25 mmol/L (ref 22–32)
Calcium: 9.3 mg/dL (ref 8.9–10.3)
Creatinine, Ser: 0.61 mg/dL (ref 0.44–1.00)
GFR calc Af Amer: >60 mL/min (ref >60)
GFR calc non Af Amer: >60 mL/min (ref >60)
Glucose, Bld: 78 mg/dL (ref 65–99)
POTASSIUM: 3.9 mmol/L (ref 3.5–5.1)
SODIUM: 139 mmol/L (ref 135–145)

## 2014-12-07 LAB — D-DIMER, QUANTITATIVE: D-Dimer, Quant: 0.52 ug/mL-FEU — ABNORMAL HIGH (ref 0.00–0.48)

## 2014-12-07 MED ORDER — HYDROMORPHONE HCL 1 MG/ML IJ SOLN
1.0000 mg | Freq: Once | INTRAMUSCULAR | Status: AC
  Administered 2014-12-07: 1 mg via INTRAVENOUS
  Filled 2014-12-07: qty 1

## 2014-12-07 MED ORDER — SODIUM CHLORIDE 0.9 % IV BOLUS (SEPSIS)
1000.0000 mL | Freq: Once | INTRAVENOUS | Status: AC
  Administered 2014-12-07: 1000 mL via INTRAVENOUS

## 2014-12-07 MED ORDER — MORPHINE SULFATE 4 MG/ML IJ SOLN
4.0000 mg | Freq: Once | INTRAMUSCULAR | Status: AC
  Administered 2014-12-07: 4 mg via INTRAVENOUS
  Filled 2014-12-07: qty 1

## 2014-12-07 MED ORDER — IOHEXOL 350 MG/ML SOLN
100.0000 mL | Freq: Once | INTRAVENOUS | Status: AC | PRN
  Administered 2014-12-07: 100 mL via INTRAVENOUS

## 2014-12-07 MED ORDER — ONDANSETRON HCL 4 MG/2ML IJ SOLN
4.0000 mg | Freq: Once | INTRAMUSCULAR | Status: AC
  Administered 2014-12-07: 4 mg via INTRAVENOUS
  Filled 2014-12-07: qty 2

## 2014-12-07 NOTE — ED Provider Notes (Signed)
CSN: [id number]     Arrival date & time 12/07/14  1719 History   First MD Initiated Contact with Patient 12/07/14 2122     Chief Complaint  Patient presents with  . chest pain      (Consider location/radiation/quality/duration/timing/severity/associated sxs/prior Treatment) HPI Comments: Patient is a 39 year old female who presents with chest pain that started yesterday. The pain started gradually and progressively worsening since the onset. The pain is sharp and severe and located in her central chest with radiation to her left chest. She reports associated SOB and dry cough. Deep inspiration makes the pain worse. No alleviating factors. No other associated symptoms. Patient reports recent 6 hour car trip last week. She denies leg swelling. No previous DVT/PE, exogenous estrogen, or recent surgery/immobilization.    Past Medical History  Diagnosis Date  . GERD (gastroesophageal reflux disease)    Past Surgical History  Procedure Laterality Date  . Eye surgery    . Knee surgery     No family history on file. History  Substance Use Topics  . Smoking status: Current Every Day Smoker    Types: Cigarettes  . Smokeless tobacco: Not on file  . Alcohol Use: No   OB History    No data available     Review of Systems  Constitutional: Negative for fever, chills and fatigue.  HENT: Negative for trouble swallowing.   Eyes: Negative for visual disturbance.  Respiratory: Positive for cough and shortness of breath.   Cardiovascular: Positive for chest pain. Negative for palpitations.  Gastrointestinal: Negative for nausea, vomiting, abdominal pain and diarrhea.  Genitourinary: Negative for dysuria and difficulty urinating.  Musculoskeletal: Negative for arthralgias and neck pain.  Skin: Negative for color change.  Neurological: Negative for dizziness and weakness.  Psychiatric/Behavioral: Negative for dysphoric mood.      Allergies  Nsaids; Beeswax; and Penicillins  Home  Medications   Prior to Admission medications   Medication Sig Start Date End Date Taking? Authorizing Provider  cyclobenzaprine (FLEXERIL) 5 MG tablet Take 1 tablet (5 mg total) by mouth 2 (two) times daily as needed for muscle spasms. 10/09/14  Yes Tilden Fossa, MD  diazepam (VALIUM) 5 MG tablet Take 1 tablet (5 mg total) by mouth 2 (two) times daily. 11/20/14  Yes Gerhard Munch, MD  hydrOXYzine (ATARAX/VISTARIL) 25 MG tablet Take 1 tablet by mouth every 6 (six) hours as needed for anxiety (anxiety).  09/26/14  Yes Historical Provider, MD  omeprazole (PRILOSEC) 40 MG capsule Take 40 mg by mouth daily. 12/02/14  Yes Historical Provider, MD  promethazine (PHENERGAN) 25 MG tablet Take 25 mg by mouth every 8 (eight) hours as needed. for nausea 12/02/14  Yes Historical Provider, MD  ranitidine (ZANTAC) 150 MG capsule Take 150 mg by mouth daily.  12/02/14  Yes Historical Provider, MD  traMADol (ULTRAM) 50 MG tablet Take 1 tablet (50 mg total) by mouth every 6 (six) hours as needed for severe pain. 11/20/14  Yes Gerhard Munch, MD  HYDROcodone-acetaminophen (NORCO/VICODIN) 5-325 MG per tablet Take 1 tablet by mouth every 6 (six) hours as needed. Patient not taking: Reported on 12/07/2014 10/09/14   Tilden Fossa, MD   BP 106/53 mmHg  Pulse 79  Temp(Src) 98.4 F (36.9 C) (Oral)  Resp 16  Wt 125 lb (56.7 kg)  SpO2 98%  LMP 11/13/2014 Physical Exam  Constitutional: She is oriented to person, place, and time. She appears well-developed and well-nourished. No distress.  HENT:  Head: Normocephalic and atraumatic.  Eyes:  Conjunctivae are normal.  Neck: Normal range of motion.  Cardiovascular: Regular rhythm.  Exam reveals no gallop and no friction rub.   No murmur heard. tachycardic  Pulmonary/Chest: Effort normal and breath sounds normal. She has no wheezes. She has no rales. She exhibits no tenderness.  Abdominal: Soft. She exhibits no distension. There is no tenderness. There is no rebound and no  guarding.  Musculoskeletal: Normal range of motion.  Neurological: She is alert and oriented to person, place, and time. Coordination normal.  Speech is goal-oriented. Moves limbs without ataxia.   Skin: Skin is warm and dry.  Psychiatric: She has a normal mood and affect. Her behavior is normal.  Nursing note and vitals reviewed.   ED Course  Procedures (including critical care time) Labs Review Labs Reviewed  CBC - Abnormal; Notable for the following:    WBC 17.5 (*)    All other components within normal limits  D-DIMER, QUANTITATIVE - Abnormal; Notable for the following:    D-Dimer, Quant 0.52 (*)    All other components within normal limits  BASIC METABOLIC PANEL  BRAIN NATRIURETIC PEPTIDE  DIFFERENTIAL  I-STAT TROPOININ, ED  POC URINE PREG, ED    Imaging Review Dg Chest 2 View  12/07/2014   CLINICAL DATA:  Central chest pain radiating to LEFT side of body since Saturday night, constant since yesterday worse with coughing and deep inspiration, some shortness of breath, smoker, history GERD  EXAM: CHEST  2 VIEW  COMPARISON:  10/09/2014  FINDINGS: Normal heart size, mediastinal contours, and pulmonary vascularity.  Lungs mildly hyperinflated but clear.  No pleural effusion or pneumothorax.  Osseous structures unremarkable.  IMPRESSION: Mildly hyperinflated lungs without acute infiltrate.   Electronically Signed   By: Ulyses SouthwardMark  Boles M.D.   On: 12/07/2014 18:45   Ct Angio Chest Pe W/cm &/or Wo Cm  12/07/2014   CLINICAL DATA:  Central and LEFT body chest pain for 3 days, worsening. Shortness of breath.  EXAM: CT ANGIOGRAPHY CHEST WITH CONTRAST  TECHNIQUE: Multidetector CT imaging of the chest was performed using the standard protocol during bolus administration of intravenous contrast. Multiplanar CT image reconstructions and MIPs were obtained to evaluate the vascular anatomy.  CONTRAST:  100mL OMNIPAQUE IOHEXOL 350 MG/ML SOLN  COMPARISON:  Chest radiograph Dec 07, 2014 at 1832 hours   FINDINGS: PULMONARY ARTERY: Adequate contrast opacification of the pulmonary artery's. Main pulmonary artery is not enlarged. No pulmonary arterial filling defects to the level of the subsegmental branches.  MEDIASTINUM: Heart and pericardium are unremarkable, no right heart strain. Thoracic aorta is normal course and caliber, unremarkable. No lymphadenopathy by CT size criteria. Small mediastinal lymph nodes are likely reactive.  LUNGS: Tracheobronchial tree is patent, no pneumothorax. No pleural effusions, focal consolidations, pulmonary nodules or masses. Mildly attenuated lung parenchyma with moderate centrilobular emphysema. Dependent atelectasis.  SOFT TISSUES AND OSSEOUS STRUCTURES: Included view of the abdomen is unremarkable. Visualized soft tissues and included osseous structures appear normal.  Review of the MIP images confirms the above findings.  IMPRESSION: No acute pulmonary embolism.  Emphysema without acute pulmonary process.  Dependent atelectasis.   Electronically Signed   By: Awilda Metroourtnay  Bloomer   On: 12/07/2014 23:49     EKG Interpretation   Date/Time:  Monday Dec 07 2014 17:27:15 EDT Ventricular Rate:  100 PR Interval:  126 QRS Duration: 92 QT Interval:  345 QTC Calculation: 445 R Axis:   61 Text Interpretation:  Sinus tachycardia Probable left atrial enlargement  Baseline wander in  lead(s) II III aVF V3 Since last tracing rate faster  Confirmed by MILLER  MD, BRIAN ([id number]) on 12/07/2014 11:10:44 PM      MDM   Final diagnoses:  SOB (shortness of breath)  Chest pain  Pleurisy    10:33 PM Chest xray shows no acute changes. Labs show elevated d-dimer at 0.52 and elevated WBC at 17.5. Patient tachycardic on arrival. Patient will have CT angio.   12:09 AM  CT angio unremarkable for acute changes. Patient will be treated for cough with hycodan and naprosyn. Vitals stable and patient afebrile.   Emilia BeckKaitlyn Lashaye Fisk, PA-C 12/08/14 0019  Eber HongBrian Miller, MD 12/08/14 1036

## 2014-12-07 NOTE — ED Notes (Addendum)
Pt states that she has central chest pain that radiates to the left side of her body that started Saturday night. Pt states that it has been constant since yesterday but has gotten worse when she coughs or takes a deep breath.  Pt states that she has SHOB with pain.

## 2014-12-08 LAB — DIFFERENTIAL
Basophils Absolute: 0 10*3/uL (ref 0.0–0.1)
Basophils Relative: 0 % (ref 0–1)
Eosinophils Absolute: 0.3 10*3/uL (ref 0.0–0.7)
Eosinophils Relative: 2 % (ref 0–5)
Lymphocytes Relative: 26 % (ref 12–46)
Lymphs Abs: 4.7 10*3/uL — ABNORMAL HIGH (ref 0.7–4.0)
Monocytes Absolute: 1.2 10*3/uL — ABNORMAL HIGH (ref 0.1–1.0)
Monocytes Relative: 7 % (ref 3–12)
Neutro Abs: 11.6 10*3/uL — ABNORMAL HIGH (ref 1.7–7.7)
Neutrophils Relative %: 65 % (ref 43–77)

## 2014-12-08 MED ORDER — HYDROCODONE-HOMATROPINE 5-1.5 MG/5ML PO SYRP: 5.0000 mL | ORAL_SOLUTION | Freq: Four times a day (QID) | ORAL | Status: AC | PRN

## 2014-12-08 NOTE — Discharge Instructions (Signed)
Take hycodan as needed for cough. Refer to attached documents for more information. Return to the ED with worsening or concerning symptoms.  °

## 2014-12-08 NOTE — ED Notes (Signed)
Patient is alert and oriented x3.  She was given DC instructions and follow up visit instructions.  Patient gave verbal understanding. She was DC ambulatory under her own power to home.  V/S stable.  He was not showing any signs of distress on DC 

## 2014-12-22 ENCOUNTER — Emergency Department (HOSPITAL_BASED_OUTPATIENT_CLINIC_OR_DEPARTMENT_OTHER)
Admission: EM | Admit: 2014-12-22 | Discharge: 2014-12-22 | Disposition: A | Discharge status: Home / Self Care | Payer: Medicaid Other | Attending: Emergency Medicine | Admitting: Emergency Medicine

## 2014-12-22 ENCOUNTER — Encounter (HOSPITAL_BASED_OUTPATIENT_CLINIC_OR_DEPARTMENT_OTHER): Payer: Self-pay | Admitting: Emergency Medicine

## 2014-12-22 ENCOUNTER — Emergency Department (HOSPITAL_BASED_OUTPATIENT_CLINIC_OR_DEPARTMENT_OTHER): Payer: Medicaid Other

## 2014-12-22 DIAGNOSIS — Z79899 Other long term (current) drug therapy: Secondary | ICD-10-CM | POA: Insufficient documentation

## 2014-12-22 DIAGNOSIS — Y998 Other external cause status: Secondary | ICD-10-CM | POA: Diagnosis not present

## 2014-12-22 DIAGNOSIS — Z720 Tobacco use: Secondary | ICD-10-CM | POA: Diagnosis not present

## 2014-12-22 DIAGNOSIS — Y9289 Other specified places as the place of occurrence of the external cause: Secondary | ICD-10-CM | POA: Insufficient documentation

## 2014-12-22 DIAGNOSIS — K219 Gastro-esophageal reflux disease without esophagitis: Secondary | ICD-10-CM | POA: Diagnosis not present

## 2014-12-22 DIAGNOSIS — S60511A Abrasion of right hand, initial encounter: Secondary | ICD-10-CM | POA: Insufficient documentation

## 2014-12-22 DIAGNOSIS — Y9389 Activity, other specified: Secondary | ICD-10-CM | POA: Diagnosis not present

## 2014-12-22 DIAGNOSIS — S6991XA Unspecified injury of right wrist, hand and finger(s), initial encounter: Secondary | ICD-10-CM | POA: Diagnosis present

## 2014-12-22 DIAGNOSIS — Z880 Allergy status to penicillin: Secondary | ICD-10-CM | POA: Insufficient documentation

## 2014-12-22 DIAGNOSIS — W2209XA Striking against other stationary object, initial encounter: Secondary | ICD-10-CM | POA: Diagnosis not present

## 2014-12-22 DIAGNOSIS — M79641 Pain in right hand: Secondary | ICD-10-CM

## 2014-12-22 MED ORDER — TETANUS-DIPHTH-ACELL PERTUSSIS 5-2.5-18.5 LF-MCG/0.5 IM SUSP
0.5000 mL | Freq: Once | INTRAMUSCULAR | Status: AC
  Administered 2014-12-22: 0.5 mL via INTRAMUSCULAR
  Filled 2014-12-22: qty 0.5

## 2014-12-22 NOTE — ED Notes (Signed)
Pt cleaned hands with dial soap at sink. Bacitracin applied to wounds on hand. Pt dressed wound with drsg that she came in with. Instructed pt I would put a clean drsg on. Pt States she did not want it and states she needs to go. Pt instructed I would watch for d/c instructions.

## 2014-12-22 NOTE — ED Notes (Signed)
Pt walked out without discharge instructions.
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
## 2014-12-30 DIAGNOSIS — Y9289 Other specified places as the place of occurrence of the external cause: Secondary | ICD-10-CM | POA: Insufficient documentation

## 2014-12-30 DIAGNOSIS — F419 Anxiety disorder, unspecified: Secondary | ICD-10-CM | POA: Diagnosis not present

## 2014-12-30 DIAGNOSIS — Y998 Other external cause status: Secondary | ICD-10-CM | POA: Diagnosis not present

## 2014-12-30 DIAGNOSIS — Y93G1 Activity, food preparation and clean up: Secondary | ICD-10-CM | POA: Diagnosis not present

## 2014-12-30 DIAGNOSIS — S6991XA Unspecified injury of right wrist, hand and finger(s), initial encounter: Secondary | ICD-10-CM | POA: Diagnosis present

## 2014-12-30 DIAGNOSIS — W010XXA Fall on same level from slipping, tripping and stumbling without subsequent striking against object, initial encounter: Secondary | ICD-10-CM | POA: Insufficient documentation

## 2014-12-30 DIAGNOSIS — Z79899 Other long term (current) drug therapy: Secondary | ICD-10-CM | POA: Insufficient documentation

## 2014-12-30 DIAGNOSIS — S61200A Unspecified open wound of right index finger without damage to nail, initial encounter: Secondary | ICD-10-CM | POA: Insufficient documentation

## 2014-12-30 DIAGNOSIS — Z880 Allergy status to penicillin: Secondary | ICD-10-CM | POA: Insufficient documentation

## 2014-12-30 DIAGNOSIS — Z720 Tobacco use: Secondary | ICD-10-CM | POA: Insufficient documentation

## 2014-12-30 DIAGNOSIS — K219 Gastro-esophageal reflux disease without esophagitis: Secondary | ICD-10-CM | POA: Diagnosis not present

## 2014-12-31 ENCOUNTER — Encounter (HOSPITAL_COMMUNITY): Payer: Self-pay | Admitting: *Deleted

## 2014-12-31 ENCOUNTER — Emergency Department (HOSPITAL_COMMUNITY)
Admission: EM | Admit: 2014-12-31 | Discharge: 2014-12-31 | Discharge status: Against Medical Advice | Payer: Medicaid Other | Attending: Emergency Medicine | Admitting: Emergency Medicine

## 2014-12-31 DIAGNOSIS — S6991XA Unspecified injury of right wrist, hand and finger(s), initial encounter: Secondary | ICD-10-CM

## 2014-12-31 MED ORDER — LIDOCAINE-PRILOCAINE 2.5-2.5 % EX CREA
TOPICAL_CREAM | Freq: Once | CUTANEOUS | Status: DC
  Filled 2014-12-31: qty 5

## 2014-12-31 MED ORDER — LORAZEPAM 1 MG PO TABS: 1.0000 mg | ORAL_TABLET | Freq: Once | ORAL | Status: DC
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
## 2014-12-31 NOTE — ED Notes (Signed)
Pt states that she was cleaning up the yard tonight and slipped in the ditch and landed on some glass; pt c/o right hand pain; pt with skin tear to rt index finger knuckle; pt c/o pain to hand pain to all four knuckles
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
## 2016-08-04 IMAGING — CT CT ANGIO CHEST
2 of 6 series · 19 of 36 positions shown · IV contrast (omnipaque)
Comparison: Chest radiograph December 07, 2014 at 9156 hours

CLINICAL DATA: Central and LEFT body chest pain for 3 days,
worsening. Shortness of breath.

EXAM:
CT ANGIOGRAPHY CHEST WITH CONTRAST
TECHNIQUE: Multidetector CT imaging of the chest was performed using the
standard protocol during bolus administration of intravenous
contrast. Multiplanar CT image reconstructions and MIPs were
obtained to evaluate the vascular anatomy.
CONTRAST:  100mL OMNIPAQUE IOHEXOL 350 MG/ML SOLN

[Series 5: coronal mpr · coronal · 0.44mm/px · 1 of 111 slices shown]
[im 56/111  mediastinal]
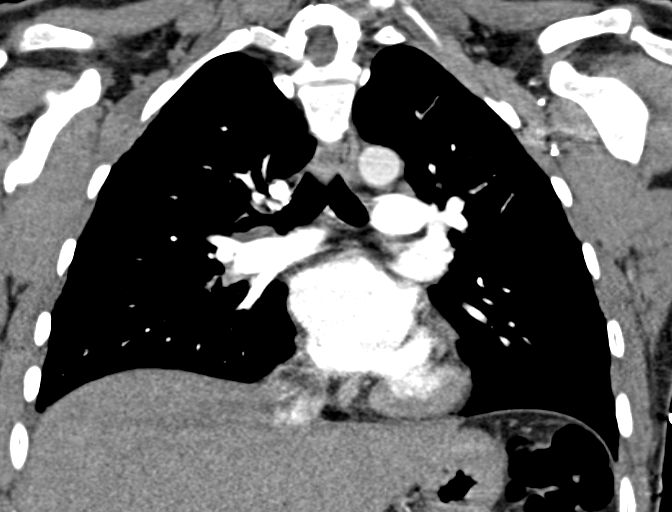

[Series 10: thins for pacs · axial · 0.66mm/px · z∈[+1462,+1666]mm · 18 of 228 slices shown]
[im 12/228  lung]
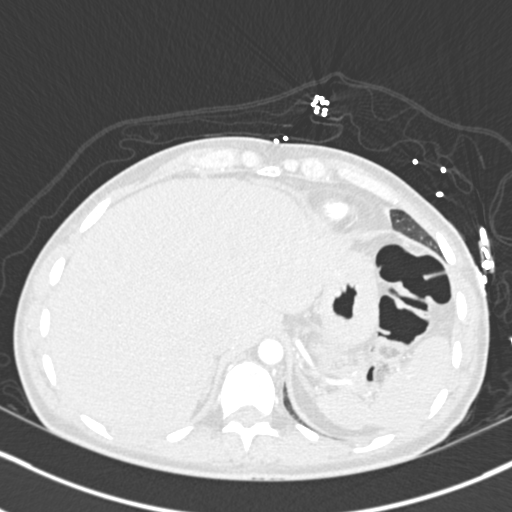
[im 23/228  mediastinal]
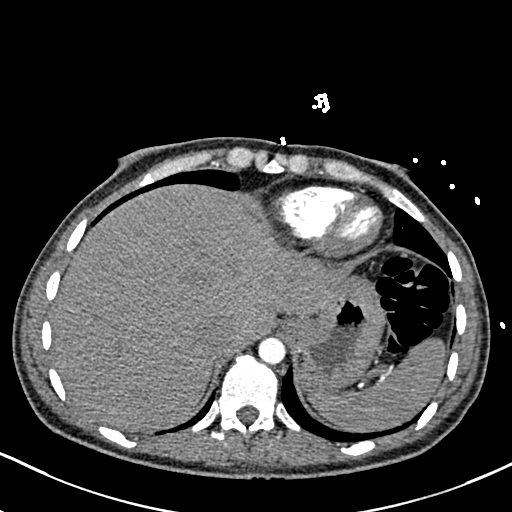
[im 35/228  lung]
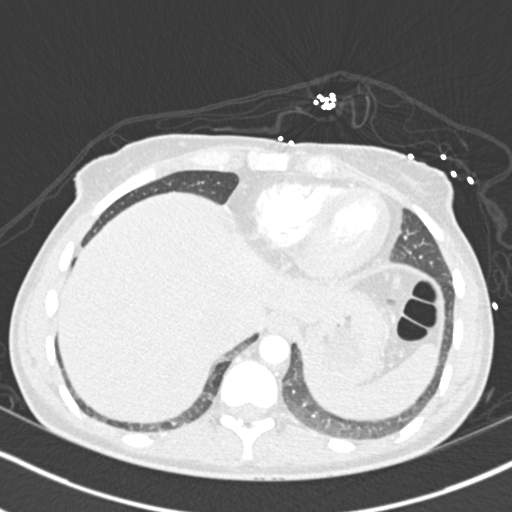
[im 46/228  mediastinal]
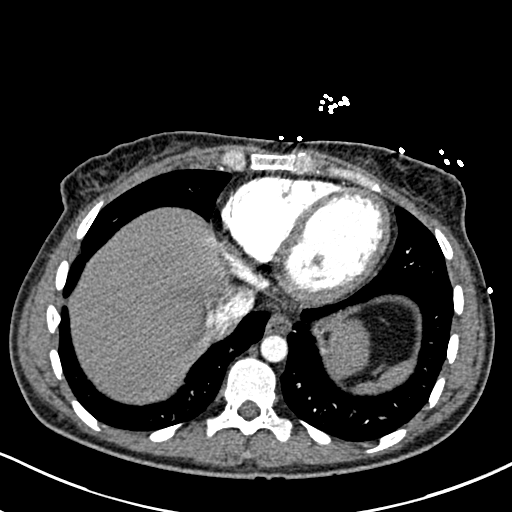
[im 57/228  lung]
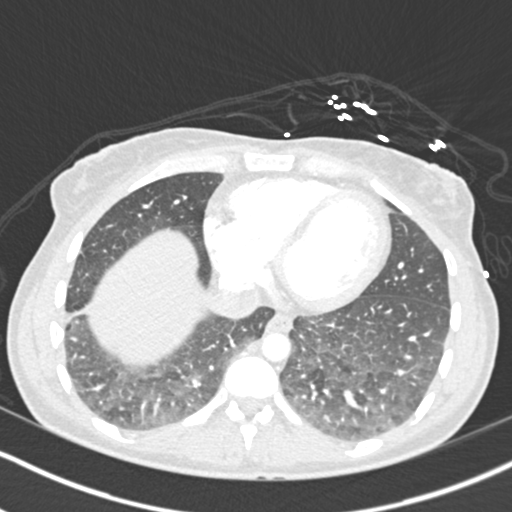
[im 69/228  mediastinal]
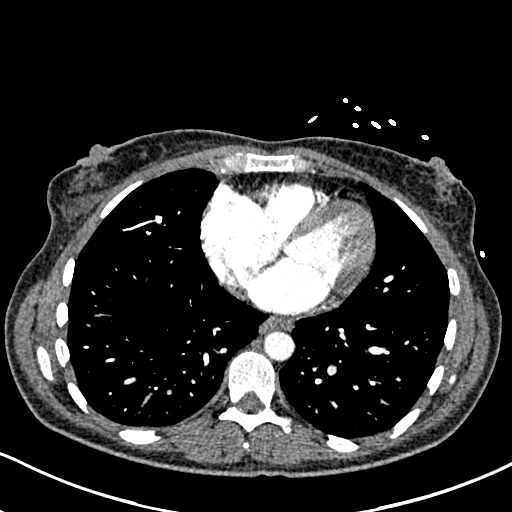
[im 80/228  lung]
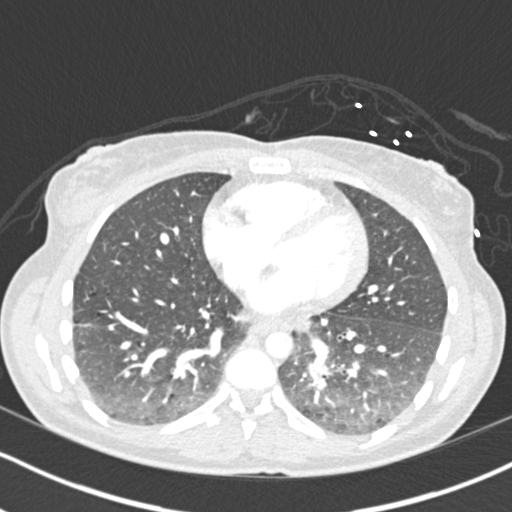
[im 91/228  mediastinal]
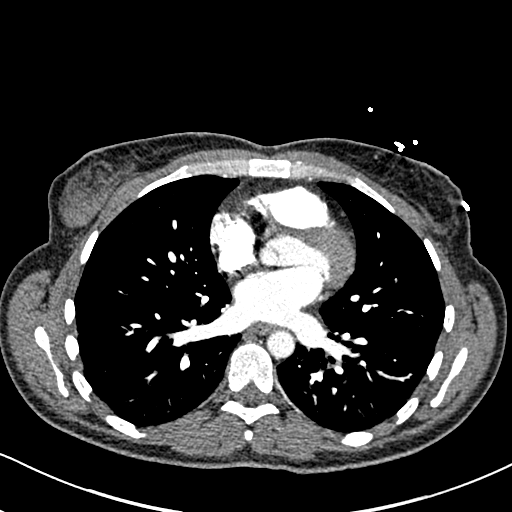
[im 103/228  lung]
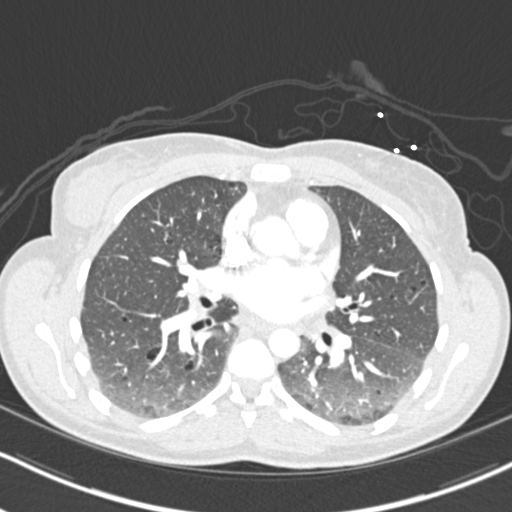
[im 125/228  mediastinal]
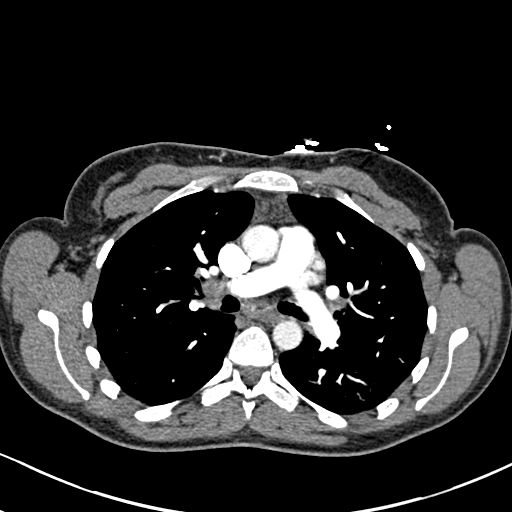
[im 137/228  lung]
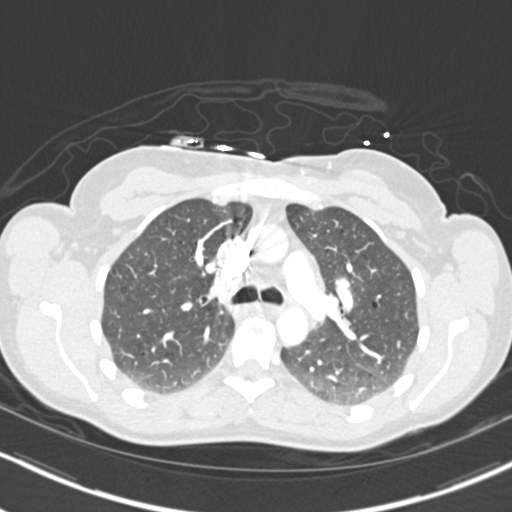
[im 148/228  mediastinal]
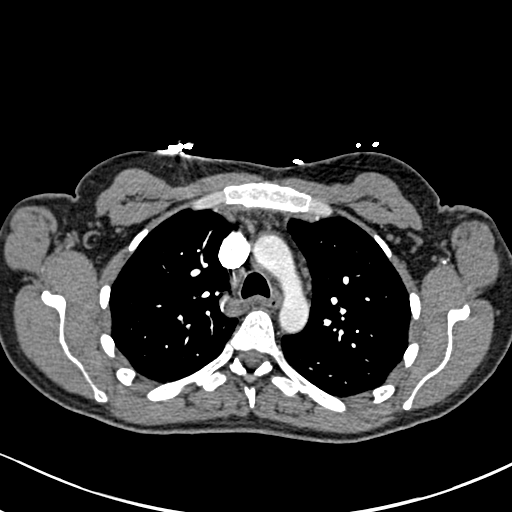
[im 159/228  lung]
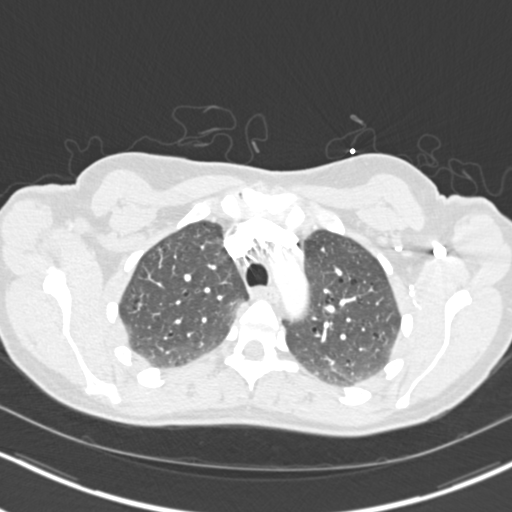
[im 171/228  mediastinal]
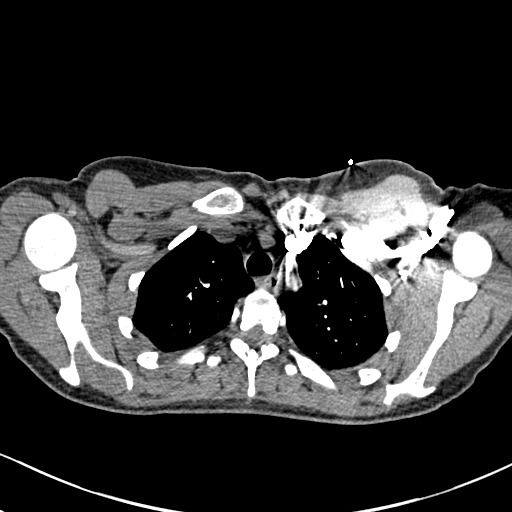
[im 182/228  lung]
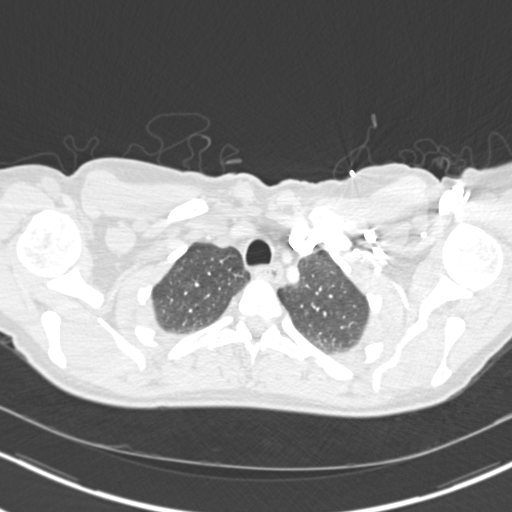
[im 193/228  mediastinal]
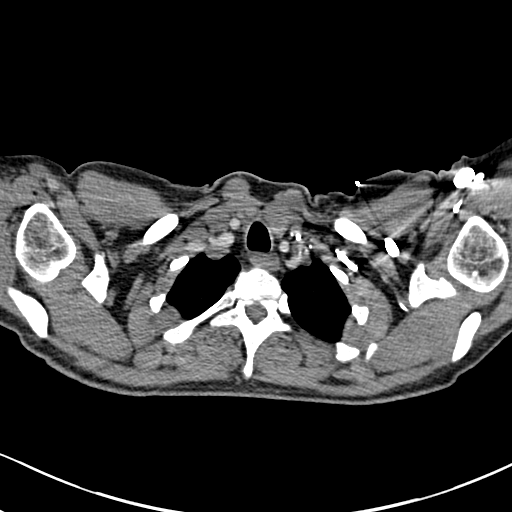
[im 205/228  lung]
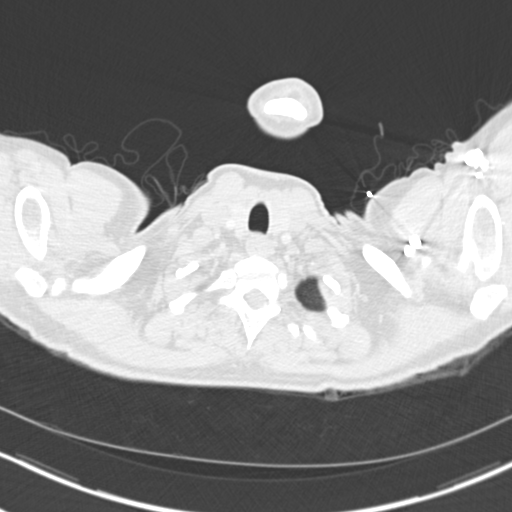
[im 216/228  mediastinal]
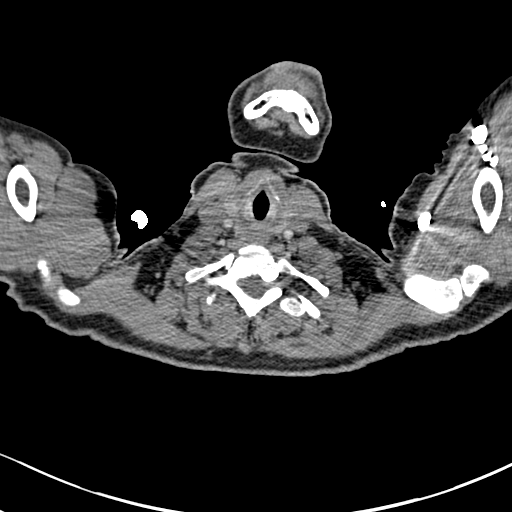

[19 of 36 positions shown; findings below may reference images not displayed]

FINDINGS: PULMONARY ARTERY: Adequate contrast opacification of the pulmonary
artery's. Main pulmonary artery is not enlarged. No pulmonary
arterial filling defects to the level of the subsegmental branches.

MEDIASTINUM: Heart and pericardium are unremarkable, no right heart
strain. Thoracic aorta is normal course and caliber, unremarkable.
No lymphadenopathy by CT size criteria. Small mediastinal lymph
nodes are likely reactive.

LUNGS: Tracheobronchial tree is patent, no pneumothorax. No pleural
effusions, focal consolidations, pulmonary nodules or masses. Mildly
attenuated lung parenchyma with moderate centrilobular emphysema.
Dependent atelectasis.

SOFT TISSUES AND OSSEOUS STRUCTURES: Included view of the abdomen is
unremarkable. Visualized soft tissues and included osseous
structures appear normal.

Review of the MIP images confirms the above findings.
IMPRESSION: No acute pulmonary embolism.

Emphysema without acute pulmonary process.  Dependent atelectasis.

By: Danii Aujla
